# Patient Record
Sex: Female | Born: 1965 | Race: Black or African American | Hispanic: No | Marital: Single | Smoking: Never smoker
Health system: Southern US, Community
[De-identification: ages and names within clinical notes are randomized; demographics above are authoritative.]

## PROBLEM LIST (undated history)

## (undated) DIAGNOSIS — E119 Type 2 diabetes mellitus without complications: Secondary | ICD-10-CM

## (undated) DIAGNOSIS — I10 Essential (primary) hypertension: Secondary | ICD-10-CM

## (undated) HISTORY — PX: ABDOMINAL HYSTERECTOMY: SHX81

---

## 2014-07-03 ENCOUNTER — Emergency Department (HOSPITAL_COMMUNITY)
Admission: EM | Admit: 2014-07-03 | Discharge: 2014-07-03 | Disposition: A | Discharge status: Home / Self Care | Payer: Medicaid Other | Attending: Emergency Medicine | Admitting: Emergency Medicine

## 2014-07-03 ENCOUNTER — Emergency Department (INDEPENDENT_AMBULATORY_CARE_PROVIDER_SITE_OTHER): Payer: Medicaid Other

## 2014-07-03 ENCOUNTER — Encounter (HOSPITAL_COMMUNITY): Payer: Self-pay | Admitting: Emergency Medicine

## 2014-07-03 ENCOUNTER — Emergency Department (HOSPITAL_COMMUNITY): Payer: Medicaid Other

## 2014-07-03 ENCOUNTER — Encounter (HOSPITAL_COMMUNITY): Payer: Self-pay | Admitting: Nurse Practitioner

## 2014-07-03 ENCOUNTER — Emergency Department (INDEPENDENT_AMBULATORY_CARE_PROVIDER_SITE_OTHER)
Admission: EM | Admit: 2014-07-03 | Discharge: 2014-07-03 | Disposition: A | Discharge status: Nursing Home (Includes ICF) | Payer: BLUE CROSS/BLUE SHIELD | Source: Home / Self Care | Attending: Family Medicine | Admitting: Family Medicine

## 2014-07-03 DIAGNOSIS — I10 Essential (primary) hypertension: Secondary | ICD-10-CM | POA: Insufficient documentation

## 2014-07-03 DIAGNOSIS — J069 Acute upper respiratory infection, unspecified: Secondary | ICD-10-CM | POA: Diagnosis not present

## 2014-07-03 DIAGNOSIS — R52 Pain, unspecified: Secondary | ICD-10-CM

## 2014-07-03 DIAGNOSIS — R0789 Other chest pain: Secondary | ICD-10-CM | POA: Insufficient documentation

## 2014-07-03 DIAGNOSIS — Z79899 Other long term (current) drug therapy: Secondary | ICD-10-CM | POA: Diagnosis not present

## 2014-07-03 DIAGNOSIS — E119 Type 2 diabetes mellitus without complications: Secondary | ICD-10-CM | POA: Diagnosis not present

## 2014-07-03 DIAGNOSIS — R0602 Shortness of breath: Secondary | ICD-10-CM | POA: Diagnosis not present

## 2014-07-03 DIAGNOSIS — R000 Tachycardia, unspecified: Secondary | ICD-10-CM | POA: Diagnosis not present

## 2014-07-03 DIAGNOSIS — R0981 Nasal congestion: Secondary | ICD-10-CM

## 2014-07-03 DIAGNOSIS — R531 Weakness: Secondary | ICD-10-CM | POA: Diagnosis present

## 2014-07-03 HISTORY — DX: Essential (primary) hypertension: I10

## 2014-07-03 HISTORY — DX: Type 2 diabetes mellitus without complications: E11.9

## 2014-07-03 LAB — BASIC METABOLIC PANEL
Anion gap: 11 (ref 5–15)
BUN: 14 mg/dL (ref 6–20)
CHLORIDE: 97 mmol/L — AB (ref 101–111)
CO2: 25 mmol/L (ref 22–32)
CREATININE: 0.98 mg/dL (ref 0.44–1.00)
Calcium: 9.4 mg/dL (ref 8.9–10.3)
GFR calc Af Amer: >60 mL/min (ref >60)
GFR calc non Af Amer: >60 mL/min (ref >60)
Glucose, Bld: 390 mg/dL — ABNORMAL HIGH (ref 65–99)
Potassium: 4.7 mmol/L (ref 3.5–5.1)
Sodium: 133 mmol/L — ABNORMAL LOW (ref 135–145)

## 2014-07-03 LAB — BRAIN NATRIURETIC PEPTIDE: B Natriuretic Peptide: 7.1 pg/mL (ref 0.0–100.0)

## 2014-07-03 LAB — CBC
HCT: 45.8 % (ref 36.0–46.0)
Hemoglobin: 15.8 g/dL — ABNORMAL HIGH (ref 12.0–15.0)
MCH: 27.1 pg (ref 26.0–34.0)
MCHC: 34.5 g/dL (ref 30.0–36.0)
MCV: 78.6 fL (ref 78.0–100.0)
Platelets: 251 10*3/uL (ref 150–400)
RBC: 5.83 MIL/uL — AB (ref 3.87–5.11)
RDW: 13.5 % (ref 11.5–15.5)
WBC: 7.3 10*3/uL (ref 4.0–10.5)

## 2014-07-03 LAB — D-DIMER, QUANTITATIVE (NOT AT ARMC): D-Dimer, Quant: 1.46 ug/mL-FEU — ABNORMAL HIGH (ref 0.00–0.48)

## 2014-07-03 LAB — I-STAT TROPONIN, ED: Troponin i, poc: 0 ng/mL (ref 0.00–0.08)

## 2014-07-03 MED ORDER — PREDNISONE 20 MG PO TABS: 40.0000 mg | ORAL_TABLET | Freq: Every day | ORAL | Status: AC

## 2014-07-03 MED ORDER — IBUPROFEN 800 MG PO TABS
800.0000 mg | ORAL_TABLET | Freq: Once | ORAL | Status: AC
Start: 2014-07-03 — End: 2014-07-03
  Administered 2014-07-03: 800 mg via ORAL
  Filled 2014-07-03: qty 1

## 2014-07-03 MED ORDER — IOHEXOL 350 MG/ML SOLN
80.0000 mL | Freq: Once | INTRAVENOUS | Status: AC | PRN
  Administered 2014-07-03: 100 mL via INTRAVENOUS

## 2014-07-03 MED ORDER — ACETAMINOPHEN 500 MG PO TABS
1000.0000 mg | ORAL_TABLET | Freq: Once | ORAL | Status: DC
  Filled 2014-07-03: qty 2

## 2014-07-03 MED ORDER — IPRATROPIUM-ALBUTEROL 0.5-2.5 (3) MG/3ML IN SOLN
RESPIRATORY_TRACT | Status: AC
  Filled 2014-07-03: qty 3

## 2014-07-03 MED ORDER — ACETAMINOPHEN 325 MG PO TABS
975.0000 mg | ORAL_TABLET | Freq: Once | ORAL | Status: AC
  Administered 2014-07-03: 975 mg via ORAL

## 2014-07-03 MED ORDER — IPRATROPIUM-ALBUTEROL 0.5-2.5 (3) MG/3ML IN SOLN
3.0000 mL | Freq: Once | RESPIRATORY_TRACT | Status: AC
  Administered 2014-07-03: 3 mL via RESPIRATORY_TRACT

## 2014-07-03 MED ORDER — SODIUM CHLORIDE 0.9 % IV BOLUS (SEPSIS)
1000.0000 mL | Freq: Once | INTRAVENOUS | Status: AC
  Administered 2014-07-03: 1000 mL via INTRAVENOUS

## 2014-07-03 MED ORDER — ALBUTEROL SULFATE HFA 108 (90 BASE) MCG/ACT IN AERS
2.0000 | INHALATION_SPRAY | RESPIRATORY_TRACT | Status: DC | PRN
  Administered 2014-07-03: 2 via RESPIRATORY_TRACT
  Filled 2014-07-03: qty 6.7

## 2014-07-03 MED ORDER — IPRATROPIUM-ALBUTEROL 0.5-2.5 (3) MG/3ML IN SOLN: 3.0000 mL | Freq: Once | RESPIRATORY_TRACT | Status: DC

## 2014-07-03 MED ORDER — ACETAMINOPHEN 325 MG PO TABS
ORAL_TABLET | ORAL | Status: AC
  Filled 2014-07-03: qty 3

## 2014-07-03 NOTE — ED Notes (Addendum)
Pt was sent from [facility] for further evaluation of weakness, sob and fatigue x 5 days.. She was unable to ambulate in the clinic without feeling SOB.  She is A&Ox4, resp e/u. Pt was given breathing treatment and tylenol at [facility] with no relief

## 2014-07-03 NOTE — ED Notes (Signed)
Pt ambulated around nurses station. Pt had to stop multiple times to cough but O2 stats remained between 98 and 100%

## 2014-07-03 NOTE — ED Provider Notes (Signed)
Catherine Conner is a 49 y.o. female who presents to Urgent Care today for cough congestion runny nose body aches chest tightness weakness fatigue headache and sore throat. Symptoms present for about 5 days. Patient denies significant chest pain or palpitations. She has a chest tightness that is worse with deep inspiration. No vomiting or diarrhea. She's tried Mucinex but no anti-pyretics or analgesics.   Past Medical History  Diagnosis Date  . Hypertension   . Diabetes mellitus without complication    History reviewed. No pertinent past surgical history. History  Substance Use Topics  . Smoking status: Never Smoker   . Smokeless tobacco: Not on file  . Alcohol Use: No   ROS as above Medications: No current facility-administered medications for this encounter.   Current Outpatient Prescriptions  Medication Sig Dispense Refill  . GLIMEPIRIDE PO Take by mouth.    . hydrochlorothiazide (HYDRODIURIL) 25 MG tablet Take 75 mg by mouth daily.    . potassium chloride (KLOR-CON) 20 MEQ packet Take by mouth 2 (two) times daily.    Marland Kitchen spironolactone (ALDACTONE) 50 MG tablet Take 50 mg by mouth daily.     No Known Allergies   Exam:  BP 165/109 mmHg  Pulse 112  Temp(Src) 99.6 F (37.6 C) (Oral)  Resp 16  SpO2 97% Gen: Ill appearing.  HEENT: EOMI,  MMM posterior pharynx with cobblestoning. Normal tympanic membranes bilaterally Lungs: Normal work of breathing. CTABL Heart: Mild tachycardia no MRG Abd: NABS, Soft. Nondistended, Nontender Exts: Brisk capillary refill, warm and well perfused.   Patient was given a 2.5/0.5 mg DuoNeb nebulizer treatment, and did not feel better.  She was also given 975mg  po tylenol which did not help.    Following the interventions patient continued to look ill.  She was asked to ambulate. She was only able to walk 2 steps before she had to rest.   ED ECG REPORT   Date: 07/03/2014  Rate: 97  Rhythm: normal sinus rhythm  QRS Axis: normal Borderline near  left axis  Intervals: normal  ST/T Wave abnormalities: normal and poor R wave progression  Conduction Disutrbances:none  Narrative Interpretation:   Old EKG Reviewed: none available  I have personally reviewed the EKG tracing and disagree with the computerized printout as noted. No Q waves or ST elevation or depression.    No results found for this or any previous visit (from the past 24 hour(s)). Dg Chest 2 View  07/03/2014   CLINICAL DATA:  Cough and shortness of breath.  Weakness.  EXAM: CHEST  2 VIEW  COMPARISON:  None.  FINDINGS: Normal sized heart. Clear lungs. Mild diffuse peribronchial thickening. Minimal thoracic spine degenerative changes.  IMPRESSION: Mild bronchitic changes.   Electronically Signed   By: Beckie Salts M.D.   On: 07/03/2014 14:02    Assessment and Plan: 49 y.o. female with weakness, SOB and fatigue.  She is unable to walk without becoming short of breath.  Transfer to ed for further evaluation and management. I am concerned about PE or some other serious etiology not easily detectable at urgent care.   Discussed warning signs or symptoms. Please see discharge instructions. Patient expresses understanding.     Rodolph Bong, MD 07/03/14 1414

## 2014-07-03 NOTE — ED Notes (Signed)
C/o  Body aches.  Weakness. Nonproductive cough.  Chest tightness.  Runny nose.   Denies fever, vomiting, and diarrhea.  No relief with otc meds,.

## 2014-07-03 NOTE — Discharge Instructions (Signed)
If you were given medicines take as directed.  If you are on coumadin or contraceptives realize their levels and effectiveness is altered by many different medicines.  If you have any reaction (rash, tongues swelling, other) to the medicines stop taking and see a physician.    If your blood pressure was elevated in the ER make sure you follow up for management with a primary doctor or return for chest pain, shortness of breath or stroke symptoms.  Please follow up as directed and return to the ER or see a physician for new or worsening symptoms.  Thank you. Filed Vitals:   07/03/14 1436 07/03/14 1530  BP: 155/99 158/90  Pulse: 109 102  Temp: 98.8 F (37.1 C)   TempSrc: Oral   Resp: 18 13  SpO2: 97% 96%    Upper Respiratory Infection, Adult An upper respiratory infection (URI) is also sometimes known as the common cold. The upper respiratory tract includes the nose, sinuses, throat, trachea, and bronchi. Bronchi are the airways leading to the lungs. Most people improve within 1 week, but symptoms can last up to 2 weeks. A residual cough may last even longer.  CAUSES Many different viruses can infect the tissues lining the upper respiratory tract. The tissues become irritated and inflamed and often become very moist. Mucus production is also common. A cold is contagious. You can easily spread the virus to others by oral contact. This includes kissing, sharing a glass, coughing, or sneezing. Touching your mouth or nose and then touching a surface, which is then touched by another person, can also spread the virus. SYMPTOMS  Symptoms typically develop 1 to 3 days after you come in contact with a cold virus. Symptoms vary from person to person. They may include:  Runny nose.  Sneezing.  Nasal congestion.  Sinus irritation.  Sore throat.  Loss of voice (laryngitis).  Cough.  Fatigue.  Muscle aches.  Loss of appetite.  Headache.  Low-grade fever. DIAGNOSIS  You might diagnose  your own cold based on familiar symptoms, since most people get a cold 2 to 3 times a year. Your caregiver can confirm this based on your exam. Most importantly, your caregiver can check that your symptoms are not due to another disease such as strep throat, sinusitis, pneumonia, asthma, or epiglottitis. Blood tests, throat tests, and X-rays are not necessary to diagnose a common cold, but they may sometimes be helpful in excluding other more serious diseases. Your caregiver will decide if any further tests are required. RISKS AND COMPLICATIONS  You may be at risk for a more severe case of the common cold if you smoke cigarettes, have chronic heart disease (such as heart failure) or lung disease (such as asthma), or if you have a weakened immune system. The very young and very old are also at risk for more serious infections. Bacterial sinusitis, middle ear infections, and bacterial pneumonia can complicate the common cold. The common cold can worsen asthma and chronic obstructive pulmonary disease (COPD). Sometimes, these complications can require emergency medical care and may be life-threatening. PREVENTION  The best way to protect against getting a cold is to practice good hygiene. Avoid oral or hand contact with people with cold symptoms. Wash your hands often if contact occurs. There is no clear evidence that vitamin C, vitamin E, echinacea, or exercise reduces the chance of developing a cold. However, it is always recommended to get plenty of rest and practice good nutrition. TREATMENT  Treatment is directed at relieving  symptoms. There is no cure. Antibiotics are not effective, because the infection is caused by a virus, not by bacteria. Treatment may include:  Increased fluid intake. Sports drinks offer valuable electrolytes, sugars, and fluids.  Breathing heated mist or steam (vaporizer or shower).  Eating chicken soup or other clear broths, and maintaining good nutrition.  Getting plenty of  rest.  Using gargles or lozenges for comfort.  Controlling fevers with ibuprofen or acetaminophen as directed by your caregiver.  Increasing usage of your inhaler if you have asthma. Zinc gel and zinc lozenges, taken in the first 24 hours of the common cold, can shorten the duration and lessen the severity of symptoms. Pain medicines may help with fever, muscle aches, and throat pain. A variety of non-prescription medicines are available to treat congestion and runny nose. Your caregiver can make recommendations and may suggest nasal or lung inhalers for other symptoms.  HOME CARE INSTRUCTIONS   Only take over-the-counter or prescription medicines for pain, discomfort, or fever as directed by your caregiver.  Use a warm mist humidifier or inhale steam from a shower to increase air moisture. This may keep secretions moist and make it easier to breathe.  Drink enough water and fluids to keep your urine clear or pale yellow.  Rest as needed.  Return to work when your temperature has returned to normal or as your caregiver advises. You may need to stay home longer to avoid infecting others. You can also use a face mask and careful hand washing to prevent spread of the virus. SEEK MEDICAL CARE IF:   After the first few days, you feel you are getting worse rather than better.  You need your caregiver's advice about medicines to control symptoms.  You develop chills, worsening shortness of breath, or brown or red sputum. These may be signs of pneumonia.  You develop yellow or brown nasal discharge or pain in the face, especially when you bend forward. These may be signs of sinusitis.  You develop a fever, swollen neck glands, pain with swallowing, or white areas in the back of your throat. These may be signs of strep throat. SEEK IMMEDIATE MEDICAL CARE IF:   You have a fever.  You develop severe or persistent headache, ear pain, sinus pain, or chest pain.  You develop wheezing, a  prolonged cough, cough up blood, or have a change in your usual mucus (if you have chronic lung disease).  You develop sore muscles or a stiff neck. Document Released: 06/26/2000 Document Revised: 03/25/2011 Document Reviewed: 04/07/2013 [facility] Patient Information 2015 [place], [place]. This information is not intended to replace advice given to you by your health care provider. Make sure you discuss any questions you have with your health care provider.

## 2014-07-03 NOTE — ED Provider Notes (Signed)
CSN: [id number]     Arrival date & time 07/03/14  1430 History   First MD Initiated Contact with Patient 07/03/14 1516     Chief Complaint  Patient presents with  . Weakness     (Consider location/radiation/quality/duration/timing/severity/associated sxs/prior Treatment) HPI Comments: 49 year old female with history of high blood pressure, diabetes, nonsmoker presents with urgent care for worsening cough, congestion, shortness of breath and chest tightness for 5 days. Patient is felt generally fatigued. Body aches. No history of pneumonia or lung disease. No cardiac history.Patient denies blood clot history, active cancer, recent major trauma or surgery, unilateral leg swelling/ pain, recent long travel, hemoptysis or oral contraceptives. Patient has tried Mucinex. Patient is given Tylenol urgent care center. Patient had EKG and chest x-ray performed prior to arrival.   The history is provided by the patient.    Past Medical History  Diagnosis Date  . Hypertension   . Diabetes mellitus without complication    History reviewed. No pertinent past surgical history. History reviewed. No pertinent family history. History  Substance Use Topics  . Smoking status: Never Smoker   . Smokeless tobacco: Not on file  . Alcohol Use: No   OB History    No data available     Review of Systems  Constitutional: Positive for appetite change. Negative for fever and chills.  HENT: Negative for congestion.   Eyes: Negative for visual disturbance.  Respiratory: Positive for cough, chest tightness and shortness of breath.   Cardiovascular: Negative for chest pain and leg swelling.  Gastrointestinal: Negative for vomiting and abdominal pain.  Genitourinary: Negative for dysuria and flank pain.  Musculoskeletal: Negative for back pain, neck pain and neck stiffness.  Skin: Negative for rash.  Neurological: Negative for light-headedness and headaches.      Allergies  Review of patient's  allergies indicates no known allergies.  Home Medications   Prior to Admission medications   Medication Sig Start Date End Date Taking? Authorizing Provider  cloNIDine (CATAPRES) 0.1 MG tablet Take 0.1 mg by mouth 2 (two) times daily. 06/10/14  Yes Historical Provider, MD  dextromethorphan-guaiFENesin (MUCINEX DM) 30-600 MG per 12 hr tablet Take 1 tablet by mouth 2 (two) times daily as needed for cough.   Yes Historical Provider, MD  Diphenhydramine-PE-APAP [facility] SINUS-MAX NIGHT TIME) 12.5-5-325 MG/10ML LIQD Take 20 mLs by mouth every 4 (four) hours as needed (cough).   Yes Historical Provider, MD  glimepiride (AMARYL) 4 MG tablet Take 4 mg by mouth 2 (two) times daily. 06/11/14  Yes Historical Provider, MD  hydrochlorothiazide (HYDRODIURIL) 25 MG tablet Take 25 mg by mouth daily.    Yes Historical Provider, MD  potassium chloride SA (K-DUR,KLOR-CON) 20 MEQ tablet Take 20 mEq by mouth daily.   Yes Historical Provider, MD  spironolactone (ALDACTONE) 50 MG tablet Take 50 mg by mouth daily.   Yes Historical Provider, MD   BP 131/93 mmHg  Pulse 93  Temp(Src) 98.8 F (37.1 C) (Oral)  Resp 13  SpO2 96% Physical Exam  Constitutional: She is oriented to person, place, and time. She appears well-developed and well-nourished.  HENT:  Head: Normocephalic and atraumatic.  Congested  Eyes: Conjunctivae are normal. Right eye exhibits no discharge. Left eye exhibits no discharge.  Neck: Normal range of motion. Neck supple. No tracheal deviation present.  Cardiovascular: Regular rhythm.  Tachycardia present.   Pulmonary/Chest: Effort normal and breath sounds normal.  Abdominal: Soft. She exhibits no distension. There is no tenderness. There is no guarding.  Musculoskeletal:  She exhibits no edema.  Neurological: She is alert and oriented to person, place, and time.  Skin: Skin is warm. No rash noted.  Psychiatric: She has a normal mood and affect.  Nursing note and vitals reviewed.   ED Course   Procedures (including critical care time) Labs Review Labs Reviewed  CBC - Abnormal; Notable for the following:    RBC 5.83 (*)    Hemoglobin 15.8 (*)    All other components within normal limits  BASIC METABOLIC PANEL - Abnormal; Notable for the following:    Sodium 133 (*)    Chloride 97 (*)    Glucose, Bld 390 (*)    All other components within normal limits  D-DIMER, QUANTITATIVE (NOT AT [facility]) - Abnormal; Notable for the following:    D-Dimer, Quant 1.46 (*)    All other components within normal limits  BRAIN NATRIURETIC PEPTIDE  I-STAT TROPOININ, ED    Imaging Review No results found. Dg Chest 2 View  07/03/2014   CLINICAL DATA:  Cough and shortness of breath.  Weakness.  EXAM: CHEST  2 VIEW  COMPARISON:  None.  FINDINGS: Normal sized heart. Clear lungs. Mild diffuse peribronchial thickening. Minimal thoracic spine degenerative changes.  IMPRESSION: Mild bronchitic changes.   Electronically Signed   By: Beckie Salts M.D.   On: 07/03/2014 14:02   Ct Angio Chest Pe W/cm &/or Wo Cm  07/03/2014   CLINICAL DATA:  Shortness of breath and chest tightness  EXAM: CT ANGIOGRAPHY CHEST WITH CONTRAST  TECHNIQUE: Multidetector CT imaging of the chest was performed using the standard protocol during bolus administration of intravenous contrast. Multiplanar CT image reconstructions and MIPs were obtained to evaluate the vascular anatomy.  CONTRAST:  OMNIPAQUE IOHEXOL 350 MG/ML SOLN  COMPARISON:  None.  FINDINGS: THORACIC INLET/BODY WALL:  Prominent thyroid volume without evidence of underlying nodule.  MEDIASTINUM:  Normal heart size. No pericardial effusion. No acute vascular abnormality, including aortic dissection or pulmonary embolism. No adenopathy.  LUNG WINDOWS:  No consolidation.  No effusion.  No suspicious pulmonary nodule.  UPPER ABDOMEN:  No acute findings.  Hepatic steatosis.  OSSEOUS:  No acute fracture.  No suspicious lytic or blastic lesions.  Review of the MIP images confirms  the above findings.  IMPRESSION: 1. Negative for pulmonary embolism or other acute finding. 2. Hepatic steatosis.   Electronically Signed   By: Marnee Spring M.D.   On: 07/03/2014 18:41     EKG Interpretation   Date/Time:  Sunday July 03 2014 14:57:28 EDT Ventricular Rate:  119 PR Interval:  166 QRS Duration: 74 QT Interval:  324 QTC Calculation: 455 R Axis:   -29 Text Interpretation:  Sinus tachycardia Cannot rule out Anterior infarct ,  age undetermined Abnormal ECG Confirmed by Joani Cosma  MD, Elaura Calix ([id number]) on  07/03/2014 3:38:47 PM Also confirmed by Jodi Mourning  MD, Lorry Furber ([id number])  on  07/03/2014 3:51:20 PM      MDM   Final diagnoses:  URI (upper respiratory infection)  Chest tightness  Body aches  Congestion of nasal sinus   Patient presented with worsening cough, general weakness, shortness of breath and chest tightness with coughing. Clinical concern for pneumonia/viral infection. Patient was sent over from urgent care and I discussed with urgent care physician concern as prior to discharge patient had very significant difficulty with ambulation became very short of breath. Patient is low risk for blood clot. D-dimer sent. IV fluids ordered. Cardiac screen in the ER.  Signed out to  fup CT results.  Filed Vitals:   07/03/14 1436 07/03/14 1530 07/03/14 1600  BP: 155/99 158/90 131/93  Pulse: 109 102 93  Temp: 98.8 F (37.1 C)    TempSrc: Oral    Resp: 18 13 13   SpO2: 97% 96% 96%       Blane Ohara, MD 07/05/14 2219

## 2015-06-15 ENCOUNTER — Ambulatory Visit (HOSPITAL_COMMUNITY)
Admission: EM | Admit: 2015-06-15 | Discharge: 2015-06-15 | Disposition: A | Discharge status: Home / Self Care | Payer: Medicaid Other | Attending: Family Medicine | Admitting: Family Medicine

## 2015-06-15 ENCOUNTER — Encounter (HOSPITAL_COMMUNITY): Payer: Self-pay | Admitting: Emergency Medicine

## 2015-06-15 ENCOUNTER — Ambulatory Visit (HOSPITAL_COMMUNITY): Payer: Self-pay

## 2015-06-15 DIAGNOSIS — I10 Essential (primary) hypertension: Secondary | ICD-10-CM | POA: Insufficient documentation

## 2015-06-15 DIAGNOSIS — M79672 Pain in left foot: Secondary | ICD-10-CM | POA: Diagnosis not present

## 2015-06-15 DIAGNOSIS — Z79899 Other long term (current) drug therapy: Secondary | ICD-10-CM | POA: Insufficient documentation

## 2015-06-15 DIAGNOSIS — Z7984 Long term (current) use of oral hypoglycemic drugs: Secondary | ICD-10-CM | POA: Insufficient documentation

## 2015-06-15 DIAGNOSIS — E119 Type 2 diabetes mellitus without complications: Secondary | ICD-10-CM | POA: Insufficient documentation

## 2015-06-15 NOTE — ED Provider Notes (Signed)
CSN: [id number]     Arrival date & time 06/15/15  1743 History   First MD Initiated Contact with Patient 06/15/15 1838     Chief Complaint  Patient presents with  . Foot Pain    right   (Consider location/radiation/quality/duration/timing/severity/associated sxs/prior Treatment) HPI History obtained from patient:  Pt presents with the cc of:  Left foot pain Duration of symptoms: 2 weeks Treatment prior to arrival: Occasional cold pack Context: Sudden onset of left foot pain no known injury. Previous symptoms when she was a child. Other symptoms include: Painful walk Pain score: 4 FAMILY HISTORY: Unknown family history    Past Medical History  Diagnosis Date  . Hypertension   . Diabetes mellitus without complication [facility])    Past Surgical History  Procedure Laterality Date  . Abdominal hysterectomy     Family History  Problem Relation Age of Onset  . Family history unknown: Yes   Social History  Substance Use Topics  . Smoking status: Never Smoker   . Smokeless tobacco: None  . Alcohol Use: No   OB History    No data available     Review of Systems  Denies: HEADACHE, NAUSEA, ABDOMINAL PAIN, CHEST PAIN, CONGESTION, DYSURIA, SHORTNESS OF BREATH  Allergies  Review of patient's allergies indicates no known allergies.  Home Medications   Prior to Admission medications   Medication Sig Start Date End Date Taking? Authorizing Provider  glimepiride (AMARYL) 4 MG tablet Take 4 mg by mouth 2 (two) times daily. 06/11/14  Yes Historical Provider, MD  hydrochlorothiazide (HYDRODIURIL) 25 MG tablet Take 25 mg by mouth daily.    Yes Historical Provider, MD  potassium chloride SA (K-DUR,KLOR-CON) 20 MEQ tablet Take 20 mEq by mouth daily.   Yes Historical Provider, MD  spironolactone (ALDACTONE) 50 MG tablet Take 50 mg by mouth daily.   Yes Historical Provider, MD  cloNIDine (CATAPRES) 0.1 MG tablet Take 0.1 mg by mouth 2 (two) times daily. 06/10/14   Historical Provider, MD   dextromethorphan-guaiFENesin (MUCINEX DM) 30-600 MG per 12 hr tablet Take 1 tablet by mouth 2 (two) times daily as needed for cough.    Historical Provider, MD  Diphenhydramine-PE-APAP [facility] SINUS-MAX NIGHT TIME) 12.5-5-325 MG/10ML LIQD Take 20 mLs by mouth every 4 (four) hours as needed (cough).    Historical Provider, MD  predniSONE (DELTASONE) 20 MG tablet Take 2 tablets (40 mg total) by mouth daily with breakfast. 07/03/14   Gilda Crease, MD   Meds Ordered and Administered this Visit  Medications - No data to display  BP 113/70 mmHg  Pulse 91  Temp(Src) 98.4 F (36.9 C) (Oral)  SpO2 98% No data found.   Physical Exam NURSES NOTES AND VITAL SIGNS REVIEWED. CONSTITUTIONAL: Well developed, well nourished, no acute distress HEENT: normocephalic, atraumatic EYES: Conjunctiva normal NECK:normal ROM, supple, no adenopathy PULMONARY:No respiratory distress, normal effort ABDOMINAL: Soft, ND, NT BS+, No CVAT MUSCULOSKELETAL: Normal ROM of all extremities, left foot is tender to palpation on the anterior surface. No visible or palpable deformity noted. No fifth metatarsal tenderness. SKIN: warm and dry without rash PSYCHIATRIC: Mood and affect, behavior are normal  ED Course  Procedures (including critical care time)  Labs Review Labs Reviewed - No data to display  Imaging Review Dg Foot Complete Left  06/15/2015  CLINICAL DATA:  Pain without trauma EXAM: LEFT FOOT - COMPLETE 3+ VIEW COMPARISON:  None. FINDINGS: There is no evidence of fracture or dislocation. There is no evidence of arthropathy or  other focal bone abnormality. Soft tissues are unremarkable. IMPRESSION: Negative. Electronically Signed   By: Gerome Samavid  Williams III M.D   On: 06/15/2015 19:04   X-ray reviewed and as part of the decision-making process. I also reviewed results with the patient.  Visual Acuity Review  Right Eye Distance:   Left Eye Distance:   Bilateral Distance:    Right Eye Near:   Left  Eye Near:    Bilateral Near:       CAM Walker application  MDM   1. Foot pain, left     Patient is reassured that there are no issues that require transfer to higher level of care at this time or additional tests. Patient is advised to continue home symptomatic treatment. Patient is advised that if there are new or worsening symptoms to attend the emergency department, contact primary care provider, or return to UC. Instructions of care provided discharged home in stable condition.    THIS NOTE WAS GENERATED USING A VOICE RECOGNITION SOFTWARE PROGRAM. ALL REASONABLE EFFORTS  WERE MADE TO PROOFREAD THIS DOCUMENT FOR ACCURACY.  I have verbally reviewed the discharge instructions with the patient. A printed AVS was given to the patient.  All questions were answered prior to discharge.       Tharon AquasFrank C Raesean Bartoletti, PA 06/15/15 1935

## 2015-06-15 NOTE — Discharge Instructions (Signed)
Cryotherapy °Cryotherapy is when you put ice on your injury. Ice helps lessen pain and puffiness (swelling) after an injury. Ice works the best when you start using it in the first 24 to 48 hours after an injury. °HOME CARE °· Put a dry or damp towel between the ice pack and your skin. °· You may press gently on the ice pack. °· Leave the ice on for no more than 10 to 20 minutes at a time. °· Check your skin after 5 minutes to make sure your skin is okay. °· Rest at least 20 minutes between ice pack uses. °· Stop using ice when your skin loses feeling (numbness). °· Do not use ice on someone who cannot tell you when it hurts. This includes small children and people with memory problems (dementia). °GET HELP RIGHT AWAY IF: °· You have white spots on your skin. °· Your skin turns blue or pale. °· Your skin feels waxy or hard. °· Your puffiness gets worse. °MAKE SURE YOU:  °· Understand these instructions. °· Will watch your condition. °· Will get help right away if you are not doing well or get worse. °  °This information is not intended to replace advice given to you by your health care provider. Make sure you discuss any questions you have with your health care provider. °  °Document Released: 06/19/2007 Document Revised: 03/25/2011 Document Reviewed: 08/23/2010 °[employer] Interactive Patient Education ©2016 [employer] ° °Heat Therapy °Heat therapy can help ease sore, stiff, injured, and tight muscles and joints. Heat relaxes your muscles, which may help ease your pain. Heat therapy should only be used on old, pre-existing, or long-lasting (chronic) injuries. Do not use heat therapy unless told by your doctor. °HOW TO USE HEAT THERAPY °There are several different kinds of heat therapy, including: °· Moist heat pack. °· Warm water bath. °· Hot water bottle. °· Electric heating pad. °· Heated gel pack. °· Heated wrap. °· Electric heating pad. °GENERAL HEAT THERAPY RECOMMENDATIONS  °· Do not sleep while using heat  therapy. Only use heat therapy while you are awake. °· Your skin may turn pink while using heat therapy. Do not use heat therapy if your skin turns red. °· Do not use heat therapy if you have new pain. °· High heat or long exposure to heat can cause burns. Be careful when using heat therapy to avoid burning your skin. °· Do not use heat therapy on areas of your skin that are already irritated, such as with a rash or sunburn. °GET HELP IF:  °· You have blisters, redness, swelling (puffiness), or numbness. °· You have new pain. °· Your pain is worse. °MAKE SURE YOU: °· Understand these instructions. °· Will watch your condition. °· Will get help right away if you are not doing well or get worse. °  °This information is not intended to replace advice given to you by your health care provider. Make sure you discuss any questions you have with your health care provider. °  °Document Released: 03/25/2011 Document Revised: 01/21/2014 Document Reviewed: 02/23/2013 °[employer] Interactive Patient Education ©2016 [employer] ° °

## 2015-06-15 NOTE — ED Notes (Signed)
Pt has been suffering from pain on the top of her left foot for two weeks.  She reports that this happened to her once before but it went away on its own.  She has pain with and without ambulation, and she denies any injury to the foot.

## 2015-06-15 NOTE — ED Notes (Signed)
Pt being taken to main campus for an xray of her left foot via shuttle.

## 2015-06-15 NOTE — ED Notes (Signed)
Cam walker applied by Nida BoatmanBrad, EMT

## 2017-02-01 IMAGING — DX DG FOOT COMPLETE 3+V*L*
3 series · 3 of 3 positions shown · non-contrast
Comparison: None.

CLINICAL DATA: Pain without trauma

EXAM:
LEFT FOOT - COMPLETE 3+ VIEW

[foot ap]
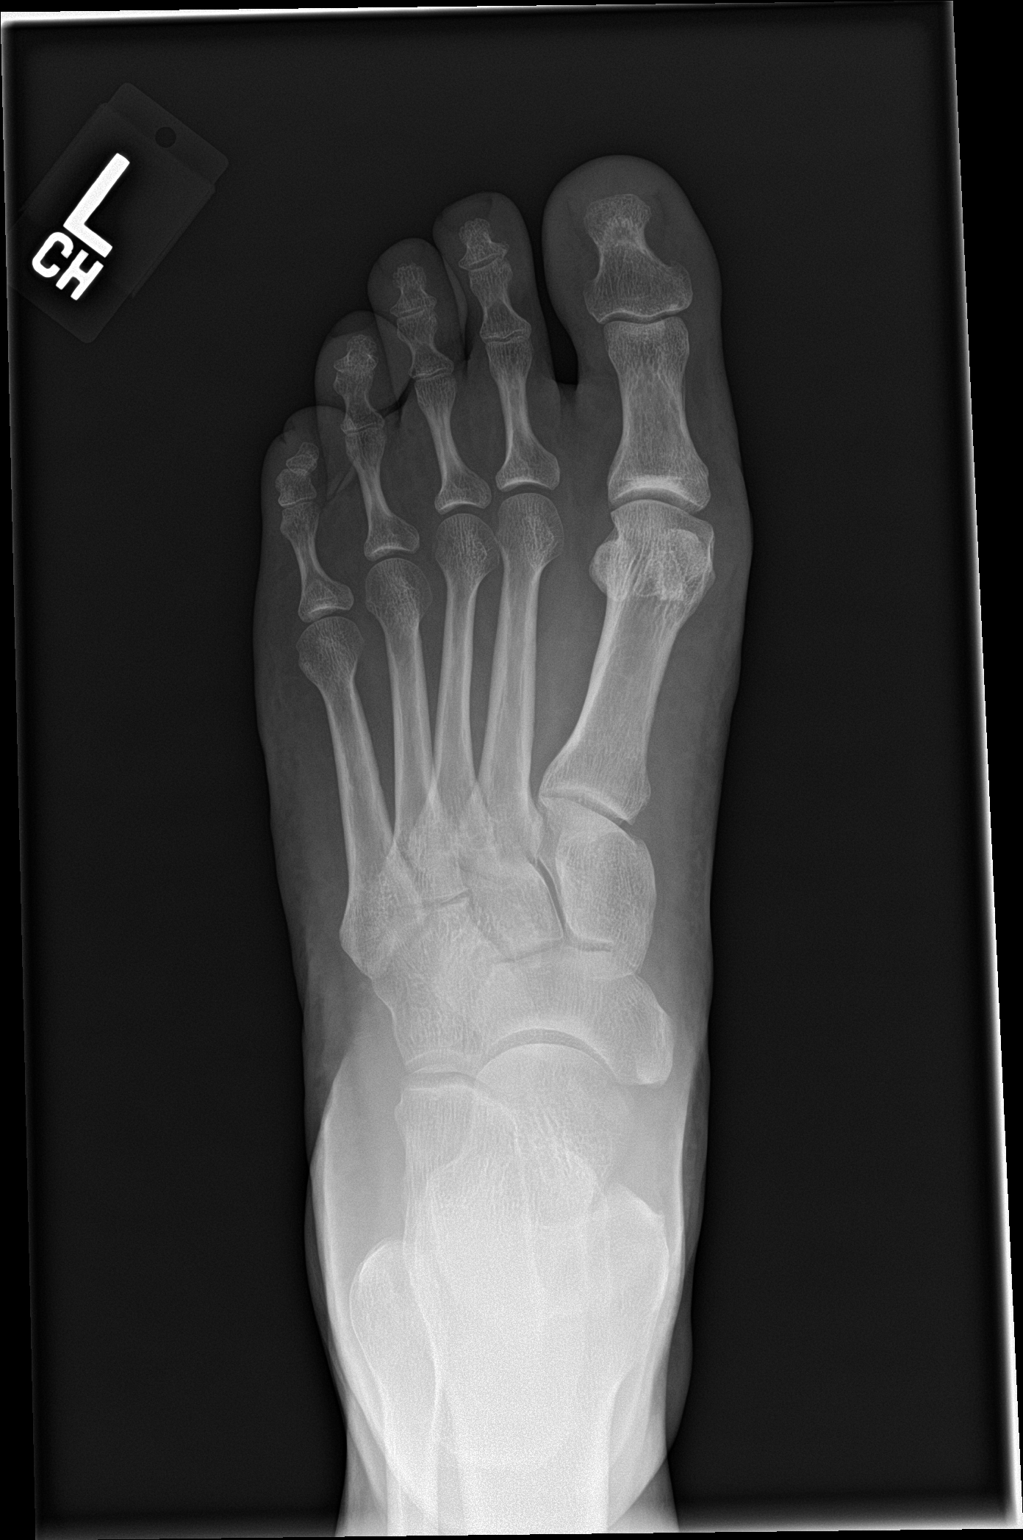

[foot obl]
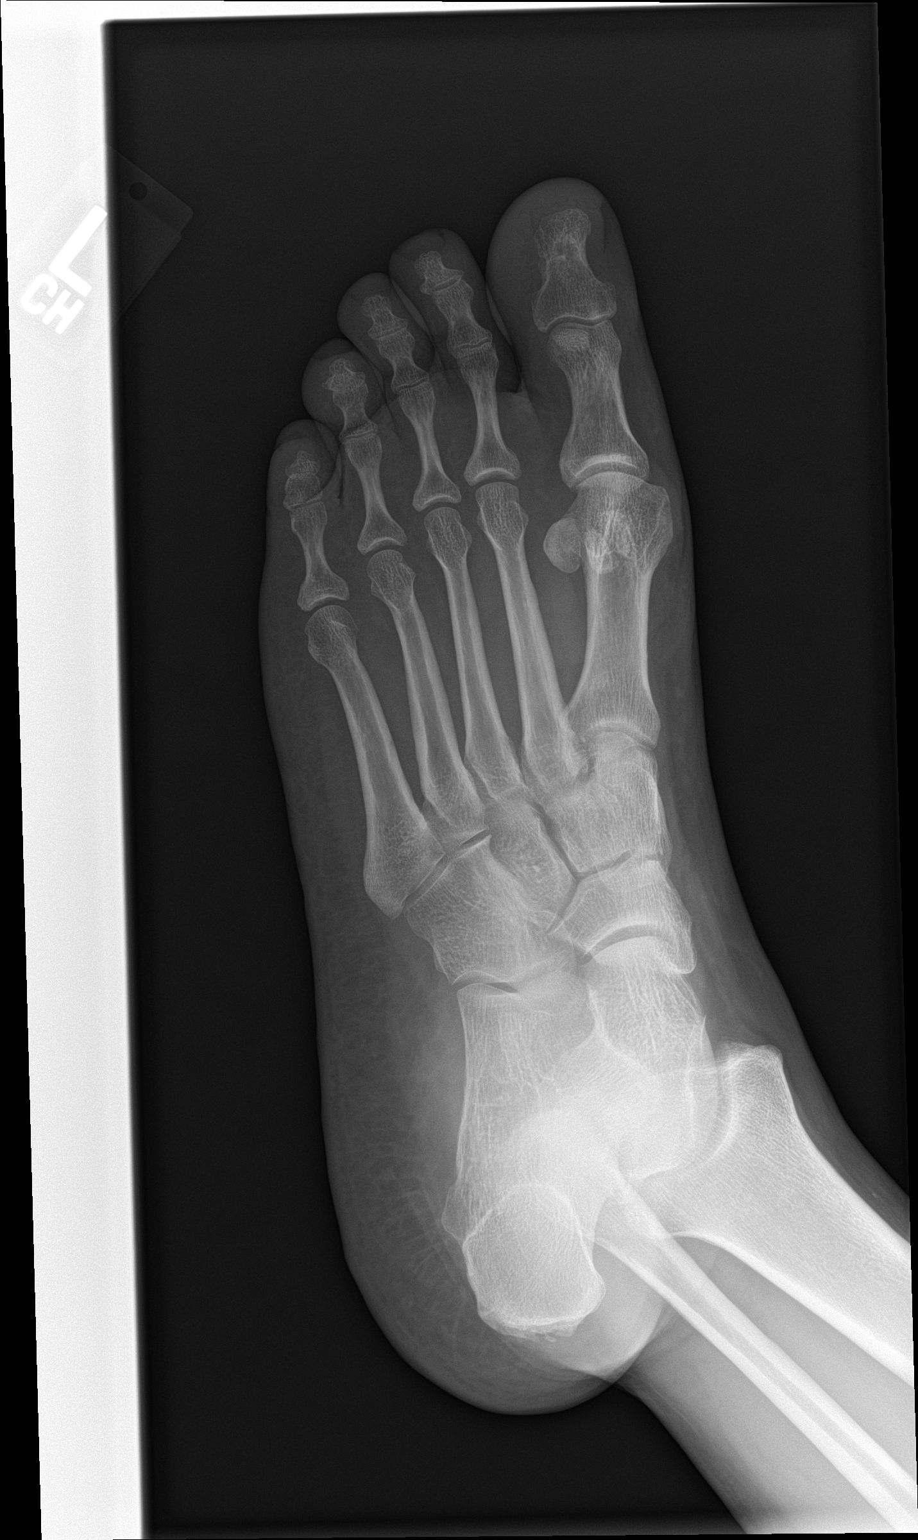

[foot lat]
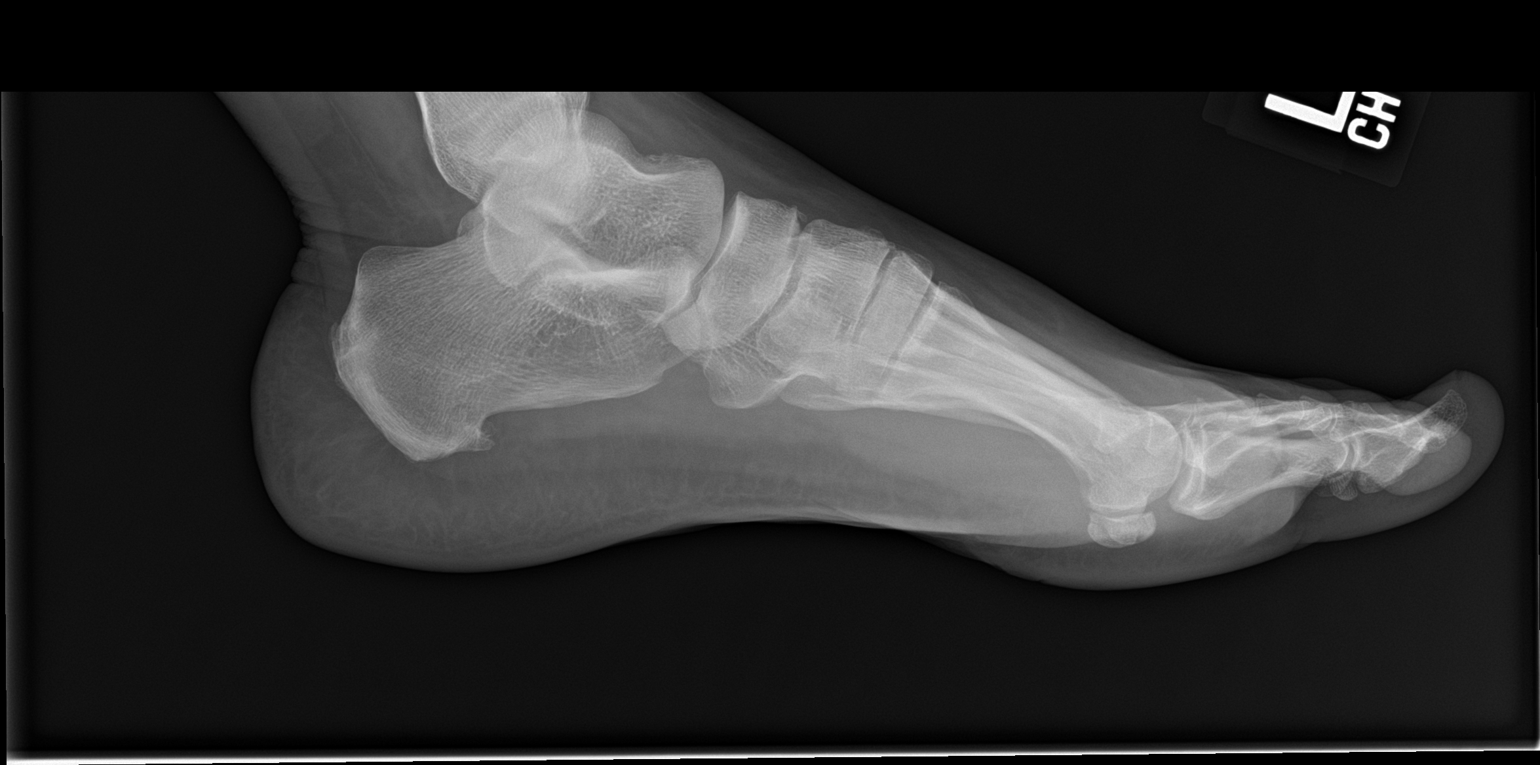

[3 of 3 positions shown; findings below may reference images not displayed]

FINDINGS: There is no evidence of fracture or dislocation. There is no
evidence of arthropathy or other focal bone abnormality. Soft
tissues are unremarkable.
IMPRESSION: Negative.
